# Patient Record
Sex: Female | Born: 1975 | Hispanic: Yes | Marital: Single | Smoking: Former smoker
Health system: Southern US, Community
[De-identification: ages and names within clinical notes are randomized; demographics above are authoritative.]

---

## 2016-07-13 ENCOUNTER — Other Ambulatory Visit: Payer: Self-pay | Admitting: Obstetrics & Gynecology

## 2016-07-13 DIAGNOSIS — Z1231 Encounter for screening mammogram for malignant neoplasm of breast: Secondary | ICD-10-CM

## 2016-07-26 ENCOUNTER — Ambulatory Visit
Admission: RE | Admit: 2016-07-26 | Discharge: 2016-07-26 | Disposition: A | Discharge status: Home / Self Care | Payer: No Typology Code available for payment source | Source: Ambulatory Visit | Attending: Obstetrics & Gynecology | Admitting: Obstetrics & Gynecology

## 2016-07-26 ENCOUNTER — Encounter: Payer: Self-pay | Admitting: Radiology

## 2016-07-26 DIAGNOSIS — Z1231 Encounter for screening mammogram for malignant neoplasm of breast: Secondary | ICD-10-CM

## 2017-07-29 ENCOUNTER — Other Ambulatory Visit: Payer: Self-pay | Admitting: Obstetrics and Gynecology

## 2017-07-29 DIAGNOSIS — Z1231 Encounter for screening mammogram for malignant neoplasm of breast: Secondary | ICD-10-CM

## 2017-08-25 ENCOUNTER — Ambulatory Visit
Admission: RE | Admit: 2017-08-25 | Discharge: 2017-08-25 | Disposition: A | Discharge status: Home / Self Care | Payer: No Typology Code available for payment source | Source: Ambulatory Visit | Attending: Obstetrics and Gynecology | Admitting: Obstetrics and Gynecology

## 2017-08-25 ENCOUNTER — Encounter (HOSPITAL_COMMUNITY): Payer: Self-pay

## 2017-08-25 ENCOUNTER — Ambulatory Visit (HOSPITAL_COMMUNITY)
Admission: RE | Admit: 2017-08-25 | Discharge: 2017-08-25 | Disposition: A | Discharge status: Home / Self Care | Payer: Self-pay | Source: Ambulatory Visit | Attending: Obstetrics and Gynecology | Admitting: Obstetrics and Gynecology

## 2017-08-25 VITALS — BP 120/80 | Temp 98.2°F | Ht 58.5 in | Wt 152.0 lb

## 2017-08-25 DIAGNOSIS — Z1239 Encounter for other screening for malignant neoplasm of breast: Secondary | ICD-10-CM

## 2017-08-25 DIAGNOSIS — Z1231 Encounter for screening mammogram for malignant neoplasm of breast: Secondary | ICD-10-CM

## 2017-08-25 NOTE — Patient Instructions (Signed)
Explained breast self awareness with Jerald Kiefosa Guevara. Patient did not need a Pap smear today due to last Pap smear was in September 2016 per patient. Let her know BCCCP will cover Pap smears every 3 years unless has a history of abnormal Pap smears. [facility] for [facility] mamm[facility]. Appointment scheduled for Thursday, August 25, 2017 at 1510. Let patient know the Breast Center will follow up with her within the next couple weeks with results of mammogram by letter or phone. Jerald Kiefosa Guevara verbalized understanding.  Michel Hendon, Kathaleen Maserhristine Poll, RN 2:06 PM

## 2017-08-25 NOTE — Progress Notes (Signed)
Patient complained of bilateral diffuse breast pain and some lower right breast pain around the time of her menstrual cycle every month. Patient rates the pain at a 4 out of 10.  Pap Smear: Pap smear not completed today. [facility] 2016 [facility] and normal per patient. Per patient has no history of an abnormal Pap smear. No Pap smear results are in Epic.  Physical exam: Breasts Breasts symmetrical. No skin abnormalities bilateral breasts. No nipple retraction bilateral breasts. No nipple discharge bilateral breasts. No lymphadenopathy. No lumps palpated bilateral breasts. No complaints of pain or tenderness on exam. [facility] for a screening mamm[facility]. Appointment scheduled for Thursday, August 25, 2017 at 1510.        Pelvic/Bimanual No Pap smear completed today since last Pap smear was in September 2016 per patient. Pap smear not indicated per BCCCP guidelines.   Smoking History: Patient is a current smoker. Discussed smoking cessation with patient. Referred [facility] the [facility] and gave resources to free smoking cessation classes at [facility].  Patient Navigation: Patient education provided. Access to services provided for patient through [facility]. Spanish interpreter provided.  Used Spanish interpreter [employer] McReynolds from [place].

## 2017-08-26 ENCOUNTER — Encounter (HOSPITAL_COMMUNITY): Payer: Self-pay | Admitting: *Deleted

## 2018-07-15 LAB — HM PAP SMEAR: HM PAP: NEGATIVE

## 2018-10-11 ENCOUNTER — Other Ambulatory Visit (HOSPITAL_COMMUNITY): Payer: Self-pay | Admitting: *Deleted

## 2018-10-11 DIAGNOSIS — Z1231 Encounter for screening mammogram for malignant neoplasm of breast: Secondary | ICD-10-CM

## 2018-10-30 ENCOUNTER — Encounter (HOSPITAL_COMMUNITY): Payer: Self-pay | Admitting: *Deleted

## 2018-10-30 ENCOUNTER — Other Ambulatory Visit (HOSPITAL_COMMUNITY): Payer: Self-pay | Admitting: *Deleted

## 2018-11-16 ENCOUNTER — Ambulatory Visit (HOSPITAL_COMMUNITY)
Admission: RE | Admit: 2018-11-16 | Discharge: 2018-11-16 | Disposition: A | Discharge status: Home / Self Care | Payer: No Typology Code available for payment source | Source: Ambulatory Visit | Attending: Obstetrics and Gynecology | Admitting: Obstetrics and Gynecology

## 2018-11-16 ENCOUNTER — Encounter (HOSPITAL_COMMUNITY): Payer: Self-pay

## 2018-11-16 ENCOUNTER — Ambulatory Visit: Payer: No Typology Code available for payment source

## 2018-11-16 ENCOUNTER — Other Ambulatory Visit (HOSPITAL_COMMUNITY): Payer: Self-pay | Admitting: *Deleted

## 2018-11-16 VITALS — BP 110/68 | Wt 150.0 lb

## 2018-11-16 DIAGNOSIS — N644 Mastodynia: Secondary | ICD-10-CM

## 2018-11-16 DIAGNOSIS — N6452 Nipple discharge: Secondary | ICD-10-CM

## 2018-11-16 DIAGNOSIS — Z1239 Encounter for other screening for malignant neoplasm of breast: Secondary | ICD-10-CM

## 2018-11-16 NOTE — Progress Notes (Signed)
Complaints of bilateral breast discharge when expresses and bilateral diffuse breast pain x one month. Patient stated the discharge is a greenish black color from both breast. Patient stated the pain comes and goes. Patient rates the pain at a 1-2 out of 2.  Pap Smear: Pap smear not completed today. Last Pap smear was 06/30/2018 at and normal. Per patient has no history of an abnormal Pap smear. Last Pap smear result is in Epic.  Physical exam: Breasts Breasts symmetrical. No skin abnormalities bilateral breasts. No nipple retraction bilateral breasts. Expressed a dark green colored discharge from the left breast and a greenish/yellowish colored discharge from the right breast on exam. Sample of discharge from both breasts sent to Cytology for evaluation. No lymphadenopathy. No lumps palpated bilateral breasts. Complaints of bilateral outer breast tenderness on exam. Referred patient to the Breast Center of [facility] for a diagnostic mammogram and bilateral breast ultrasound. Appointment scheduled for Tuesday, November 21, 2018 at 1350.        Pelvic/Bimanual No Pap smear completed today since last Pap smear was 06/30/2018. Pap smear not indicated per BCCCP guidelines.   Smoking History: Patient is a former smoker that quit 10/28/2017.  Patient Navigation: Patient education provided. Access to services provided for patient through [facility] program. Spanish interpreter provided.   Breast and Cervical Cancer Risk Assessment: Patient has no family history of breast cancer, known genetic mutations, or radiation treatment to the chest before age 68. Patient has no history of cervical dysplasia, immunocompromised, or DES exposure in-utero.  Risk Assessment    Risk Scores      11/16/2018   Last edited by: Lynnell Dike, LPN   5-year risk: 0.3 %   Lifetime risk: 5.1 %         Used Spanish interpreter Natale Lay from [place].

## 2018-11-16 NOTE — Patient Instructions (Signed)
Explained breast self awareness with Lisa Padilla. Patient did not need a Pap smear today due to last Pap smear was 06/30/2018. Let her know BCCCP will cover Pap smears every 3 years unless has a history of abnormal Pap smears. Referred patient to the Breast Center of [facility] for a diagnostic mammogram and bilateral breast ultrasound. Appointment scheduled for Tuesday, November 21, 2018 at 1350. Patient aware of appointment and will be there. Let patient know will follow up with her within the next couple weeks with results of breast discharge by phone. [employer] verbalized understanding.  Lisa Padilla, Lisa Maser, RN 12:34 PM

## 2018-11-17 ENCOUNTER — Encounter (HOSPITAL_COMMUNITY): Payer: Self-pay | Admitting: *Deleted

## 2018-11-21 ENCOUNTER — Ambulatory Visit: Payer: Self-pay

## 2018-11-21 ENCOUNTER — Ambulatory Visit
Admission: RE | Admit: 2018-11-21 | Discharge: 2018-11-21 | Disposition: A | Discharge status: Home / Self Care | Payer: Self-pay | Source: Ambulatory Visit | Attending: Obstetrics and Gynecology | Admitting: Obstetrics and Gynecology

## 2018-11-21 DIAGNOSIS — N6452 Nipple discharge: Secondary | ICD-10-CM

## 2018-11-27 ENCOUNTER — Telehealth (HOSPITAL_COMMUNITY): Payer: Self-pay | Admitting: *Deleted

## 2018-11-27 NOTE — Telephone Encounter (Signed)
Called patient with Spanish interpreter Nira Conn on 11/24/2018 and explained that her bilateral breast discharge was benign. Patient verbalized understanding.

## 2018-12-21 ENCOUNTER — Ambulatory Visit (HOSPITAL_COMMUNITY): Payer: No Typology Code available for payment source

## 2019-11-08 ENCOUNTER — Other Ambulatory Visit (HOSPITAL_COMMUNITY): Payer: Self-pay

## 2019-11-08 DIAGNOSIS — N6452 Nipple discharge: Secondary | ICD-10-CM

## 2019-11-09 ENCOUNTER — Other Ambulatory Visit: Payer: Self-pay | Admitting: Obstetrics and Gynecology

## 2019-11-09 DIAGNOSIS — N6452 Nipple discharge: Secondary | ICD-10-CM

## 2019-12-04 ENCOUNTER — Ambulatory Visit: Payer: PRIVATE HEALTH INSURANCE

## 2019-12-04 ENCOUNTER — Other Ambulatory Visit: Payer: Self-pay

## 2019-12-04 ENCOUNTER — Ambulatory Visit: Payer: Self-pay | Admitting: Women's Health

## 2019-12-04 ENCOUNTER — Encounter: Payer: Self-pay | Admitting: Women's Health

## 2019-12-04 ENCOUNTER — Ambulatory Visit
Admission: RE | Admit: 2019-12-04 | Discharge: 2019-12-04 | Disposition: A | Discharge status: Home / Self Care | Payer: PRIVATE HEALTH INSURANCE | Source: Ambulatory Visit | Attending: Obstetrics and Gynecology | Admitting: Obstetrics and Gynecology

## 2019-12-04 VITALS — BP 122/78 | Temp 98.5°F | Wt 149.0 lb

## 2019-12-04 DIAGNOSIS — Z1231 Encounter for screening mammogram for malignant neoplasm of breast: Secondary | ICD-10-CM

## 2019-12-04 DIAGNOSIS — Z1239 Encounter for other screening for malignant neoplasm of breast: Secondary | ICD-10-CM

## 2019-12-04 NOTE — Progress Notes (Signed)
[name] is a 44 y.o. female who presents to [facility] today with complaint of bilateral breast discharge that is green in color and only occurs on manual expression. Pt denies lumps. Pt also reports breast pain during the week before her period that is bilateral and diffuse. Pt was seen for this same concern last year, had a negative diagnostic mammogram in 10/2018 and and some of the breast discharge was sent to cytology, which was negative. Screening mammogram was recommended in one year. Patient denies any change or worsening of symptoms or any new breast complaints.   Pap Smear: Pap not smear completed today. Last Pap smear was 06/30/2018 at [facility] clinic and was normal. Per patient has no history of an abnormal Pap smear. Last Pap smear result is available in Epic.   Physical exam: Breasts Breasts symmetrical. No skin abnormalities bilateral breasts. No nipple retraction bilateral breasts. No nipple discharge bilateral breasts. No lymphadenopathy. No lumps palpated bilateral breasts.       Pelvic/Bimanual Pap is not indicated today    Smoking History: Patient has never smoked not referred to quit line.    Patient Navigation: Patient education provided. Access to services provided for patient through [facility] program. Spanish interpreter provided. No transportation provided   Colorectal Cancer Screening: Per patient has never had colonoscopy completed No complaints today.    Breast and Cervical Cancer Risk Assessment: Patient does not have family history of breast cancer, known genetic mutations, or radiation treatment to the chest before age 55. Patient does not have history of cervical dysplasia, immunocompromised, or DES exposure in-utero.  Risk Assessment    No risk assessment data for the current encounter   Risk Scores      11/16/2018   Last edited by: Lynnell Dike, LPN   5-year risk: 0.3 %   Lifetime risk: 5.1 %          A: BCCCP exam without pap  smear Complaint of bilateral breast discharge that is green in color and only occurs on manual expression. Pt denies lumps. Pt also reports breast pain during the week before her period that is bilateral and diffuse. Pt was seen for this same concern last year, had a negative diagnostic mammogram in 10/2018 and ahd some of the breast discharge sent to cytology, which was negative. Screening mammogram was recommended in one year.  P: Referred patient to the [facility] Center of [facility] for a screening mammogram per the recommendation after her diagnostic mammogram in 10/2019 with no new, worsening or changing symptoms. Appointment scheduled 12/04/2019.  Aston Lieske, Odie Sera, NP 12/04/2019 2:15 PM

## 2019-12-04 NOTE — Patient Instructions (Signed)
Autoexamen de mamas Breast Self-Awareness Autoexaminarse las mamas significa familiarizarse con el aspecto y la sensacin de las mamas al tacto. Incluye revisarse las mamas habitualmente e informarle al mdico acerca de cualquier cambio. Es importante autoexaminarse las mamas. En ocasiones, los cambios pueden no ser perjudiciales (son benignos), pero a veces un cambio en las mamas puede ser un signo de un problema mdico grave. Es importante aprender a realizar este procedimiento de modo correcto para que pueda detectar los problemas de manera temprana, cuando es ms probable que el tratamiento resulte exitoso. Todas las mujeres deben autoexaminarse las mamas, incluso aquellas que se sometieron a implantes mamarios. Lo que necesita:  Un espejo.  Una habitacin bien iluminada. Cmo realizar el autoexamen de mamas Un autoexamen de mamas es una forma de aprender qu es normal para sus mamas y si sufren modificaciones. Para hacer un autoexamen de las mamas: Busque cambios  1. Qutese toda la ropa por encima de la cintura. 2. Prese frente a un espejo en una habitacin con buena iluminacin. 3. Apoye las manos en las caderas. 4. Empuje con fuerza hacia abajo con las manos. 5. Compare las mamas en el espejo. Busque diferencias entre ellas (asimetra), por ejemplo: ? Diferencias en la forma. ? Diferencias en el tamao. ? Pliegues, depresiones y ndulos en una mama, y no en la otra. 6. Observe cada mama para buscar cambios en la piel, por ejemplo: ? Enrojecimiento. ? Zonas escamosas. 7. Observe si hay cambios en los pezones, por ejemplo: ? Secrecin. ? Sangrado. ? Hoyuelos. ? Enrojecimiento. ? Un cambio en la posicin. Palpe si hay cambios Plpese las mamas con cuidado para detectar ndulos y cambios. Lo mejor es hacerlo mientras est acostada boca arriba en el piso y nuevamente mientras est sentada o de pie en la ducha o la baera con agua jabonosa en la piel. Plpese cada mama de la  siguiente forma: 1. Coloque el brazo del lado de la mama que se examina por arriba de la cabeza. 2. Plpese la mama con la otra mano. 3. Comience en la zona del pezn y haga crculos superpuestos de de pulgada (2cm). Para hacerlo, use las yemas de los tres dedos del medio. Ejerza una presin suave, luego mediana y luego firme. La presin suave le permitir palpar el tejido ms cercano a la piel. La presin mediana le permitir palpar el tejido que est un poco ms profundo. La presin firme le permitir palpar el tejido ms cercano a las costillas. 4. Continuar superponiendo crculos y vaya hacia abajo, hasta sentir las costillas, por debajo del pecho. 5. Desplcese a una distancia del ancho de un dedo hacia el centro del cuerpo. Siga con los crculos superpuestos de de pulgada (2cm) para palpar la mama, mientras asciende lentamente hacia la clavcula. 6. Contine con el examen hacia arriba y hacia abajo con las tres presiones, hasta llegar a la axila.  Anote sus hallazgos Anotar lo que encuentra puede ayudarla a recordar qu debe consultar con el mdico. Escriba los siguientes datos:  Qu es normal para cada mama.  Cualquier cambio que encuentre en cada mama, por ejemplo: ? La clase de cambios que encuentra. ? Dolor o sensibilidad. ? Si hay bultos, su tamao y ubicacin.  En qu momento se encuentra del ciclo menstrual, si usted todava est menstruando. Recomendaciones y consejos generales  Examnese las mamas todos los meses.  Si est amamantando, el mejor momento para examinarse las mamas es despus de amamantar o de usar un sacaleches.  Si menstra,   el mejor momento para examinarse las mamas es 5 a 7das despus del perodo menstrual. Durante el perodo menstrual, las mamas en general tienen ms bultos, y tal vez sea ms difcil percibir los cambios.  Con el tiempo y la prctica, se familiarizar con las variaciones de las mamas y se sentir ms cmoda con el  examen. Comunquese con un mdico si:  Observa un cambio en la forma o el tamao de las mamas o los pezones.  Observa un cambio en la piel de las mamas o los pezones, como la piel enrojecida o escamosa.  Tiene una secrecin anormal proveniente de los pezones.  Encuentra un ndulo o una zona engrosada que no tena antes.  Tiene dolor en las mamas.  Tiene alguna inquietud relacionada con la salud de la mama. Resumen  El autoexamen de mamas incluye buscar cambios fsicos en las mamas, y tambin palpar para detectar cualquier cambio en las mamas.  El autoexamen de mamas debe hacerse frente a un espejo en una habitacin bien iluminada.  Debe examinarse las mamas todos los meses. Si menstra, el mejor momento para examinarse las mamas es de 5 a 7das despus del perodo menstrual.  Informe al mdico si nota cambios en las mamas, como cambios en el tamao, cambios en la piel, dolor o sensibilidad, o un lquido inusual que sale de los pezones. Esta informacin no tiene como fin reemplazar el consejo del mdico. Asegrese de hacerle al mdico cualquier pregunta que tenga. Document Revised: 06/13/2018 Document Reviewed: 06/13/2018 Elsevier Patient Education  2020 Elsevier Inc.  

## 2020-10-01 ENCOUNTER — Other Ambulatory Visit: Payer: Self-pay | Admitting: Obstetrics & Gynecology

## 2020-10-01 DIAGNOSIS — Z1231 Encounter for screening mammogram for malignant neoplasm of breast: Secondary | ICD-10-CM

## 2020-10-21 ENCOUNTER — Other Ambulatory Visit: Payer: Self-pay

## 2020-10-21 DIAGNOSIS — N644 Mastodynia: Secondary | ICD-10-CM

## 2020-10-27 NOTE — Addendum Note (Signed)
Addended by: Narda Rutherford on: 10/27/2020 09:47 AM   Modules accepted: Orders

## 2020-10-30 ENCOUNTER — Ambulatory Visit
Admission: RE | Admit: 2020-10-30 | Discharge: 2020-10-30 | Disposition: A | Discharge status: Home / Self Care | Payer: PRIVATE HEALTH INSURANCE | Source: Ambulatory Visit | Attending: Obstetrics and Gynecology | Admitting: Obstetrics and Gynecology

## 2020-10-30 ENCOUNTER — Other Ambulatory Visit: Payer: PRIVATE HEALTH INSURANCE

## 2020-10-30 ENCOUNTER — Ambulatory Visit: Payer: PRIVATE HEALTH INSURANCE | Admitting: *Deleted

## 2020-10-30 ENCOUNTER — Other Ambulatory Visit: Payer: Self-pay

## 2020-10-30 VITALS — BP 110/62 | Wt 144.5 lb

## 2020-10-30 DIAGNOSIS — N644 Mastodynia: Secondary | ICD-10-CM

## 2020-10-30 DIAGNOSIS — Z1239 Encounter for other screening for malignant neoplasm of breast: Secondary | ICD-10-CM

## 2020-10-30 NOTE — Patient Instructions (Signed)
Explained breast self awareness with Alfonse Alpers. Patient did not need a Pap smear today due to last Pap smear was 10/01/2020 per patient. Let her know BCCCP will cover Pap smears every 3 years unless has a history of abnormal Pap smears. Referred patient to the [facility] of [facility] for a right breast diagnostic mammogram. Appointment scheduled Thursday, October 30, 2020 at 1310. Patient aware of appointment and will be there. [employer] verbalized understanding.  Ashlee Player, Kathaleen Maser, RN 11:55 AM

## 2020-10-30 NOTE — Progress Notes (Signed)
[name] is a 45 y.o. female who presents to [facility] clinic today with complaint of right breast lump since 10/01/20 and pain x 2 months. Patient states the right breast pain comes and goes. Patient rates the pain at a 4-5 out of 10.    Pap Smear: Pap smear not completed today. Last Pap smear was 10/01/2020 at the [facility] clinic and was normal per patient. Per patient has no history of an abnormal Pap smear. Last Pap smear result is not available in Epic.   Physical exam: Breasts Breasts symmetrical. No skin abnormalities bilateral breasts. No nipple retraction bilateral breasts. No nipple discharge bilateral breasts. No lymphadenopathy. No lumps palpated bilateral breasts. Unable to palpate a lump in patients area of concern within the right breast. Complaints of bilateral outer breast tenderness on exam.      Pelvic/Bimanual Pap is not indicated today per BCCCP guidelines.   Smoking History: Patient is a former smoker that quit 10/28/2017.    Patient Navigation: Patient education provided. Access to services provided for patient through [place] program. Spanish interpreter Natale Lay from [facility] provided.   Breast and Cervical Cancer Risk Assessment: Patient does not have family history of breast cancer, known genetic mutations, or radiation treatment to the chest before age 22. Patient does not have history of cervical dysplasia, immunocompromised, or DES exposure in-utero.  Risk Assessment    Risk Scores      10/30/2020 12/04/2019   Last edited by: Narda Rutherford, LPN McGill, Sherie Demetrius Charity, LPN   5-year risk: 0.4 % 0.4 %   Lifetime risk: 5 % 5 %          A: BCCCP exam without pap smear Complaint of right breast lump and pain.  P: Referred patient to the [facility] of [facility] for a right breast diagnostic mammogram. Appointment scheduled Thursday, October 30, 2020 at 1310.  Priscille Heidelberg, RN 10/30/2020 11:55 AM

## 2020-11-17 ENCOUNTER — Other Ambulatory Visit: Payer: Self-pay | Admitting: Obstetrics and Gynecology

## 2020-11-17 DIAGNOSIS — Z1231 Encounter for screening mammogram for malignant neoplasm of breast: Secondary | ICD-10-CM

## 2020-12-11 ENCOUNTER — Ambulatory Visit: Payer: Self-pay

## 2021-01-08 ENCOUNTER — Other Ambulatory Visit: Payer: Self-pay

## 2021-01-08 ENCOUNTER — Ambulatory Visit
Admission: RE | Admit: 2021-01-08 | Discharge: 2021-01-08 | Disposition: A | Discharge status: Home / Self Care | Payer: No Typology Code available for payment source | Source: Ambulatory Visit | Attending: Obstetrics and Gynecology | Admitting: Obstetrics and Gynecology

## 2021-01-08 DIAGNOSIS — Z1231 Encounter for screening mammogram for malignant neoplasm of breast: Secondary | ICD-10-CM

## 2021-11-09 ENCOUNTER — Other Ambulatory Visit: Payer: Self-pay | Admitting: Obstetrics & Gynecology

## 2021-11-09 DIAGNOSIS — Z1231 Encounter for screening mammogram for malignant neoplasm of breast: Secondary | ICD-10-CM

## 2022-01-14 ENCOUNTER — Ambulatory Visit
Admission: RE | Admit: 2022-01-14 | Discharge: 2022-01-14 | Disposition: A | Discharge status: Home / Self Care | Payer: No Typology Code available for payment source | Source: Ambulatory Visit | Attending: Obstetrics & Gynecology | Admitting: Obstetrics & Gynecology

## 2022-01-14 DIAGNOSIS — Z1231 Encounter for screening mammogram for malignant neoplasm of breast: Secondary | ICD-10-CM

## 2022-08-16 ENCOUNTER — Emergency Department (HOSPITAL_BASED_OUTPATIENT_CLINIC_OR_DEPARTMENT_OTHER)
Admission: EM | Admit: 2022-08-16 | Discharge: 2022-08-16 | Disposition: A | Discharge status: Home / Self Care | Payer: No Typology Code available for payment source | Attending: Emergency Medicine | Admitting: Emergency Medicine

## 2022-08-16 ENCOUNTER — Emergency Department (HOSPITAL_BASED_OUTPATIENT_CLINIC_OR_DEPARTMENT_OTHER): Payer: No Typology Code available for payment source

## 2022-08-16 ENCOUNTER — Other Ambulatory Visit: Payer: Self-pay

## 2022-08-16 DIAGNOSIS — L03113 Cellulitis of right upper limb: Secondary | ICD-10-CM | POA: Insufficient documentation

## 2022-08-16 LAB — COMPREHENSIVE METABOLIC PANEL
ALT: 17 U/L (ref 0–44)
AST: 17 U/L (ref 15–41)
Albumin: 4 g/dL (ref 3.5–5.0)
Alkaline Phosphatase: 52 U/L (ref 38–126)
Anion gap: 5 (ref 5–15)
BUN: 11 mg/dL (ref 6–20)
CO2: 24 mmol/L (ref 22–32)
Calcium: 8.7 mg/dL — ABNORMAL LOW (ref 8.9–10.3)
Chloride: 110 mmol/L (ref 98–111)
Creatinine, Ser: 0.56 mg/dL (ref 0.44–1.00)
GFR, Estimated: >60 mL/min (ref >60)
Glucose, Bld: 97 mg/dL (ref 70–99)
Potassium: 4.1 mmol/L (ref 3.5–5.1)
Sodium: 139 mmol/L (ref 135–145)
Total Bilirubin: 0.3 mg/dL (ref 0.3–1.2)
Total Protein: 7.5 g/dL (ref 6.5–8.1)

## 2022-08-16 LAB — CBC WITH DIFFERENTIAL/PLATELET
Abs Immature Granulocytes: 0.02 10*3/uL (ref 0.00–0.07)
Basophils Absolute: 0 10*3/uL (ref 0.0–0.1)
Basophils Relative: 1 %
Eosinophils Absolute: 0.3 10*3/uL (ref 0.0–0.5)
Eosinophils Relative: 4 %
HCT: 41.4 % (ref 36.0–46.0)
Hemoglobin: 13.3 g/dL (ref 12.0–15.0)
Immature Granulocytes: 0 %
Lymphocytes Relative: 24 %
Lymphs Abs: 1.8 10*3/uL (ref 0.7–4.0)
MCH: 26.7 pg (ref 26.0–34.0)
MCHC: 32.1 g/dL (ref 30.0–36.0)
MCV: 83 fL (ref 80.0–100.0)
Monocytes Absolute: 0.6 10*3/uL (ref 0.1–1.0)
Monocytes Relative: 8 %
Neutro Abs: 4.8 10*3/uL (ref 1.7–7.7)
Neutrophils Relative %: 63 %
Platelets: 291 10*3/uL (ref 150–400)
RBC: 4.99 MIL/uL (ref 3.87–5.11)
RDW: 13.5 % (ref 11.5–15.5)
WBC: 7.6 10*3/uL (ref 4.0–10.5)
nRBC: 0 % (ref 0.0–0.2)

## 2022-08-16 LAB — SEDIMENTATION RATE: Sed Rate: 6 mm/hr (ref 0–22)

## 2022-08-16 MED ORDER — NAPROXEN 500 MG PO TABS: 500.0000 mg | ORAL_TABLET | Freq: Two times a day (BID) | ORAL | 0 refills | Status: AC

## 2022-08-16 MED ORDER — CEPHALEXIN 250 MG PO CAPS
500.0000 mg | ORAL_CAPSULE | Freq: Once | ORAL | Status: AC
  Administered 2022-08-16: 500 mg via ORAL
  Filled 2022-08-16: qty 2

## 2022-08-16 MED ORDER — CEPHALEXIN 500 MG PO CAPS: 500.0000 mg | ORAL_CAPSULE | Freq: Four times a day (QID) | ORAL | 0 refills | Status: AC

## 2022-08-16 NOTE — ED Triage Notes (Signed)
Patient presents to ED via POV from home. Here with right hand pain and edema. Denies any recent injury or trauma. Pain began on Wednesday.

## 2022-08-16 NOTE — ED Provider Notes (Signed)
[facility] EMERGENCY DEPARTMENT Provider Note   CSN: [id number] Arrival date & time: 08/16/22  1245     History  Chief Complaint  Patient presents with   Hand Pain    Lisa Padilla is a 46 y.o. female who presents to the emergency department with concerns for right hand pain/swelling onset 5 days.  Denies recent injury or trauma.  Denies any recent bug bites.  Has tried ibuprofen at home for her symptoms.  Also notes increased warmth noted to the area.  Denies fever.  No known drug allergies.  The history is provided by the patient. No language interpreter was used.       Home Medications Prior to Admission medications   Medication Sig Start Date End Date Taking? Authorizing Provider  cephALEXin (KEFLEX) 500 MG capsule Take 1 capsule (500 mg total) by mouth 4 (four) times daily for 7 days. 08/16/22 08/23/22 Yes Drenda Sobecki A, PA-C  naproxen (NAPROSYN) 500 MG tablet Take 1 tablet (500 mg total) by mouth 2 (two) times daily. 08/16/22  Yes Ahlivia Salahuddin A, PA-C  Multiple Vitamin (MULTIVITAMIN WITH MINERALS) TABS tablet Take 1 tablet by mouth daily.    [provider]      Allergies    Patient has no known allergies.    Review of Systems   Review of Systems  All other systems reviewed and are negative.   Physical Exam Updated Vital Signs BP 111/71   Pulse 72   Temp 98.4 F (36.9 C) (Oral)   Resp 16   SpO2 99%  Physical Exam Vitals and nursing note reviewed.  Constitutional:      General: She is not in acute distress.    Appearance: Normal appearance.  Eyes:     General: No scleral icterus.    Extraocular Movements: Extraocular movements intact.  Cardiovascular:     Rate and Rhythm: Normal rate.  Pulmonary:     Effort: Pulmonary effort is normal. No respiratory distress.  Musculoskeletal:     Cervical back: Neck supple.     Comments: Mild tenderness to palpation to fifth metacarpal at the distal aspect.  Also tenderness to palpation  noted to fifth digit.  Mild swelling noted to the fourth and fifth metacarpals/digits.  Mild increased warmth noted to the area.  No obvious deformity noted.  Patient with pain on extension of right fourth digit. Negative kanavel signs.   Skin:    General: Skin is warm and dry.     Findings: No bruising, erythema or rash.  Neurological:     Mental Status: She is alert.  Psychiatric:        Behavior: Behavior normal.        ED Results / Procedures / Treatments   Labs (all labs ordered are listed, but only abnormal results are displayed) Labs Reviewed  COMPREHENSIVE METABOLIC PANEL - Abnormal; Notable for the following components:      Result Value   Calcium 8.7 (*)    All other components within normal limits  CBC WITH DIFFERENTIAL/PLATELET  SEDIMENTATION RATE  C-REACTIVE PROTEIN    EKG None  Radiology DG Hand Complete Right  Result Date: 08/16/2022 CLINICAL DATA:  Acute right hand pain without recent injury. EXAM: RIGHT HAND - COMPLETE 3+ VIEW COMPARISON:  None Available. FINDINGS: There is no evidence of fracture or dislocation. There is no evidence of arthropathy or other focal bone abnormality. Soft tissues are unremarkable. IMPRESSION: Negative. Electronically Signed   By: Zenda Alpers.D.  On: 08/16/2022 13:15    Procedures Procedures    Medications Ordered in ED Medications  cephALEXin (KEFLEX) capsule 500 mg (500 mg Oral Given 08/16/22 1935)    ED Course/ Medical Decision Making/ A&P Clinical Course as of 08/17/22 0002  Mon Aug 16, 2022  8295 Discussed with patient lab and imaging findings and treatment plan consistent of talking with hand surgeon.  Patient agreeable at this time. [SB]  1911 Consult with hand surgeon, Dr. Frazier Butt who recommends Keflex and follow-up in the office. [SB]  1923 Discussed with patient discharge treatment plan as per hand surgeon, patient agreeable.  Patient appears safe for discharge. [SB]    Clinical Course User  Index [SB] Japleen Tornow A, PA-C                           Medical Decision Making Amount and/or Complexity of Data Reviewed Labs: ordered. Radiology: ordered.  Risk Prescription drug management.   Patient with right hand pain onset 5 days.  No recent injury or trauma.  Patient afebrile.  On exam patient with mild tenderness to palpation noted to fifth metacarpal at distal aspect.  Tenderness to palpation noted to fifth digit.  Mild swelling noted to fourth and fifth metacarpal/digits.  Mild increased warmth noted to the area.  No obvious deformity noted.  Negative Kanavel signs. Differential diagnosis includes flexor tenosynovitis, cellulitis, fracture, dislocation, sprain.   Labs:  I ordered, and personally interpreted labs.  The pertinent results include:   Sed rate unremarkable. CMP and CMP unremarkable.  CRP obtained with results pending at time of discharge  Imaging: I ordered imaging studies including right hand x-ray I independently visualized and interpreted imaging which showed: No acute findings I agree with the radiologist interpretation  Medications:  I ordered medication including Keflex for antibiotic treatment  Reevaluation of the patient after these medicines and interventions, I reevaluated the patient and found that they have improved I have reviewed the patients home medicines and have made adjustments as needed  Consultations: I requested consultation with the Hand Surgeon, Dr. Frazier Butt and discussed lab and imaging findings as well as pertinent plan - they recommend: Antibiotics and follow-up in the office   Disposition: Presentation suspicious for cellulitis of the right hand.  Doubt fracture, dislocation, sprain.  Low suspicion for flexor tenosynovitis at this time.  Case discussed with attending who evaluated patient and agrees with discharge treatment plan.  After consideration of the diagnostic results and the patients response to treatment, I feel that  the patient would benefit from Discharge home.  Patient provided with a prescription for Keflex. Supportive care measures and strict return precautions discussed with patient at bedside. Pt acknowledges and verbalizes understanding. Pt appears safe for discharge. Follow up as indicated in discharge paperwork.    This chart was dictated using voice recognition software, Dragon. Despite the best efforts of this provider to proofread and correct errors, errors may still occur which can change documentation meaning.   Final Clinical Impression(s) / ED Diagnoses Final diagnoses:  Cellulitis of right hand    Rx / DC Orders ED Discharge Orders          Ordered    cephALEXin (KEFLEX) 500 MG capsule  4 times daily        08/16/22 1913    naproxen (NAPROSYN) 500 MG tablet  2 times daily        08/16/22 1913  Jerome Viglione A, PA-C 08/17/22 2356    Glynn Octave, MD 08/18/22 [phone]

## 2022-08-16 NOTE — Discharge Instructions (Addendum)
It was a pleasure taking care of you today!   Your labs didn't show any concerning findings. Your chest xray was negative today.  You will be sent a prescription for Keflex (antibiotic), ensure to complete the entire course of the antibiotic.  You will be sent a prescription for Naprosyn, take as directed.  Attached is information for the on-call hand specialist, call and set up a follow-up appointment regarding today's ED visit.  Return to the emergency department for experience increasing/worsening symptoms.  Fue un placer cuidarte hoy!  Sus anlisis no Counselling psychologist. Tu radiografa de trax fue negativa hoy. Se le enviar una receta para Keflex (antibitico), asegrese de completar todo el tratamiento con antibiticos. Se le enviar una receta para Naprosyn, tmela segn las indicaciones. Se adjunta informacin para el especialista de guardia, llame y programe una cita de seguimiento con respecto a la visita al [employer] [facility] [place]. Regrese al departamento de emergencias si experimenta sntomas que aumentan o empeoran.

## 2022-08-16 NOTE — ED Notes (Signed)
ED Provider at bedside. 

## 2022-08-17 LAB — C-REACTIVE PROTEIN: CRP: 0.9 mg/dL (ref <1.0)

## 2022-11-02 ENCOUNTER — Other Ambulatory Visit: Payer: Self-pay | Admitting: Obstetrics & Gynecology

## 2022-11-02 DIAGNOSIS — Z1231 Encounter for screening mammogram for malignant neoplasm of breast: Secondary | ICD-10-CM

## 2022-11-04 IMAGING — MG MM DIGITAL SCREENING BILAT W/ TOMO AND CAD
8 series · 9 of 24 positions shown · non-contrast
Comparison: Previous exam(s).

CLINICAL DATA: Screening.

EXAM:
DIGITAL SCREENING BILATERAL MAMMOGRAM WITH TOMOSYNTHESIS AND CAD
TECHNIQUE: Bilateral screening digital craniocaudal and mediolateral oblique
mammograms were obtained. Bilateral screening digital breast
tomosynthesis was performed. The images were evaluated with
computer-aided detection.

[L MLO synth-2D]
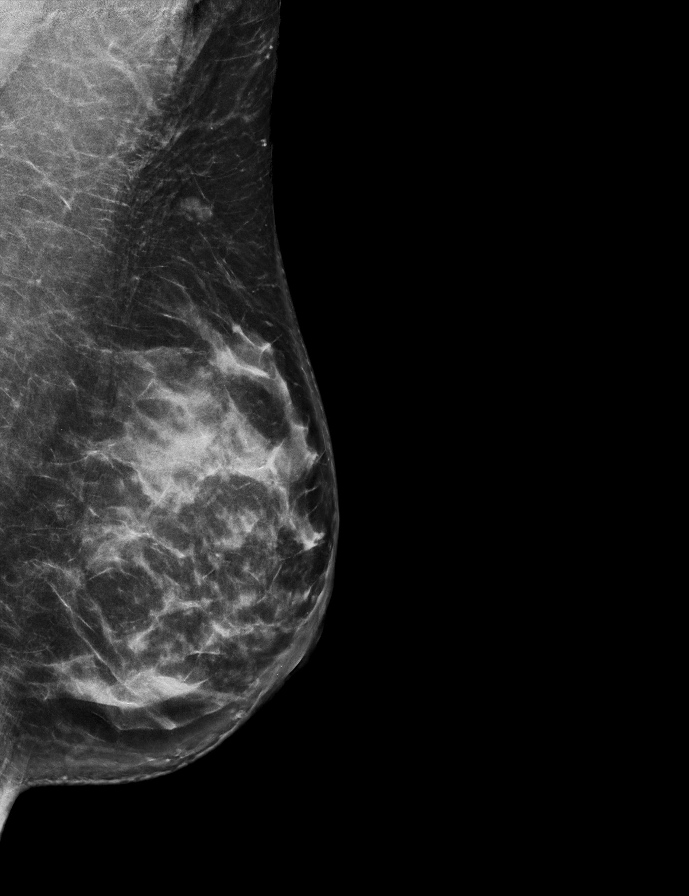

[R MLO synth-2D]
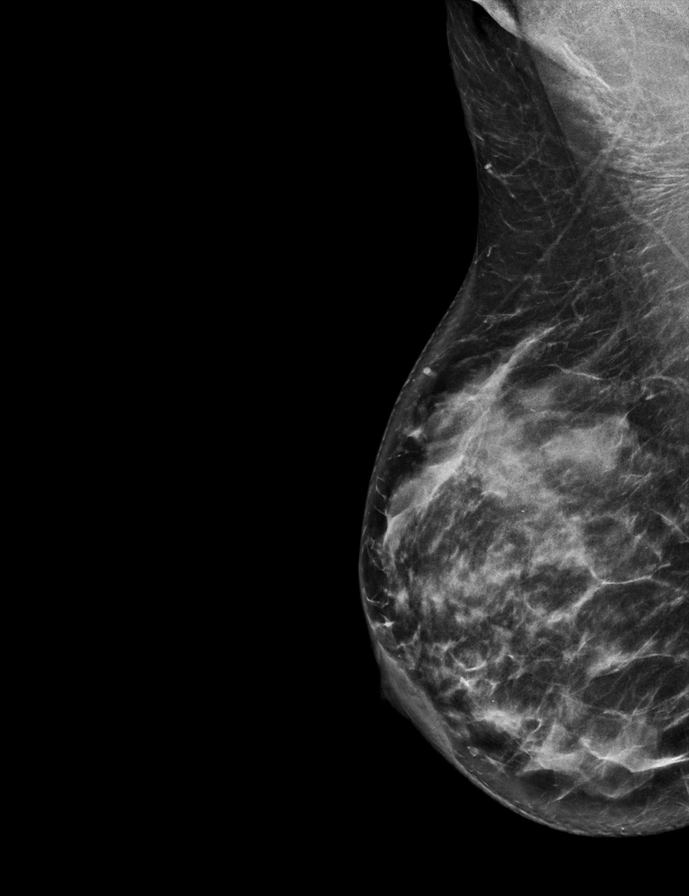

[L CC synth-2D]
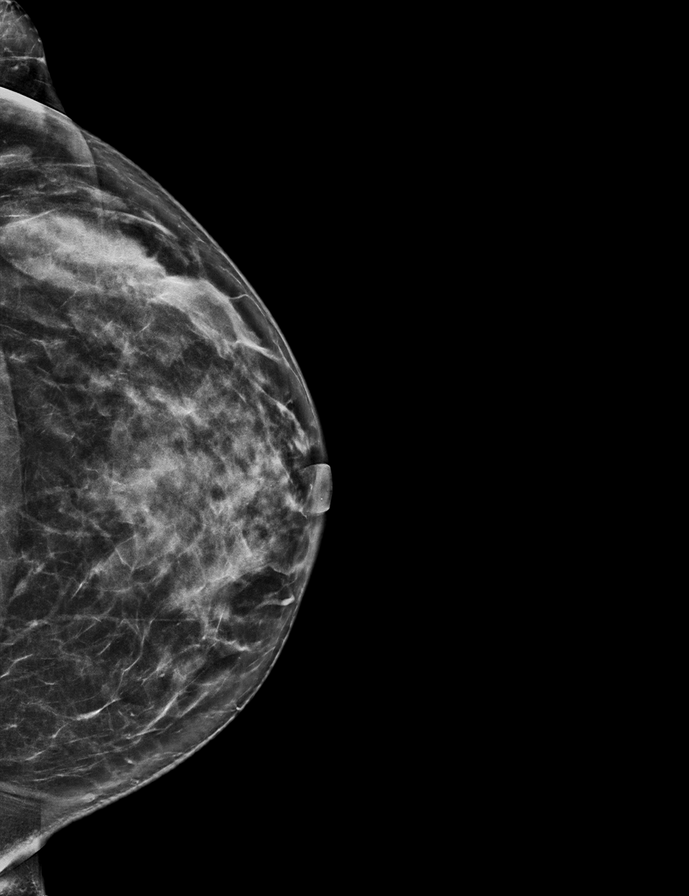

[R CC synth-2D]
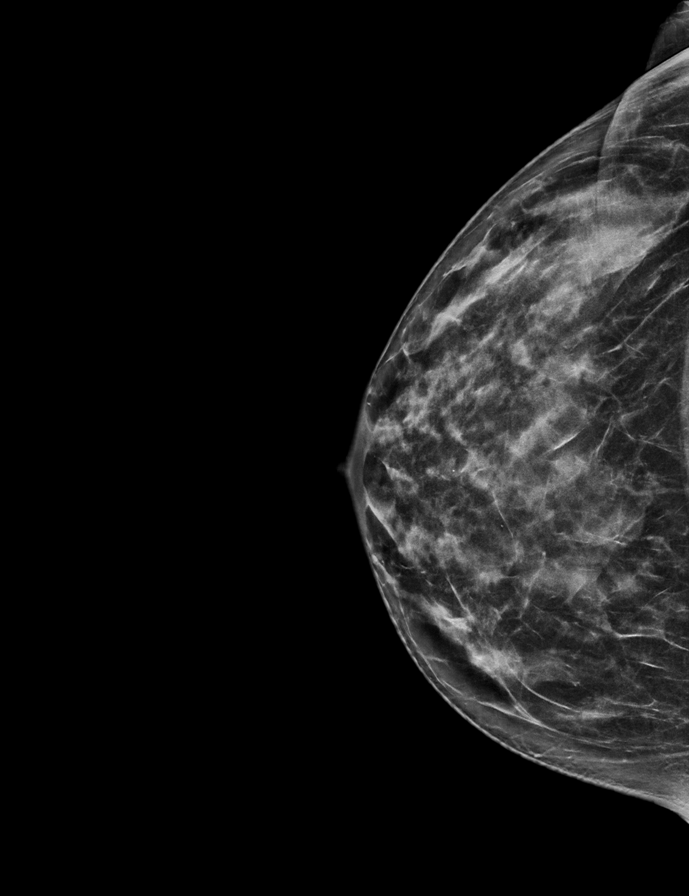

[L MLO tomo · 2 of 72 frames shown]
[frame 24/72]
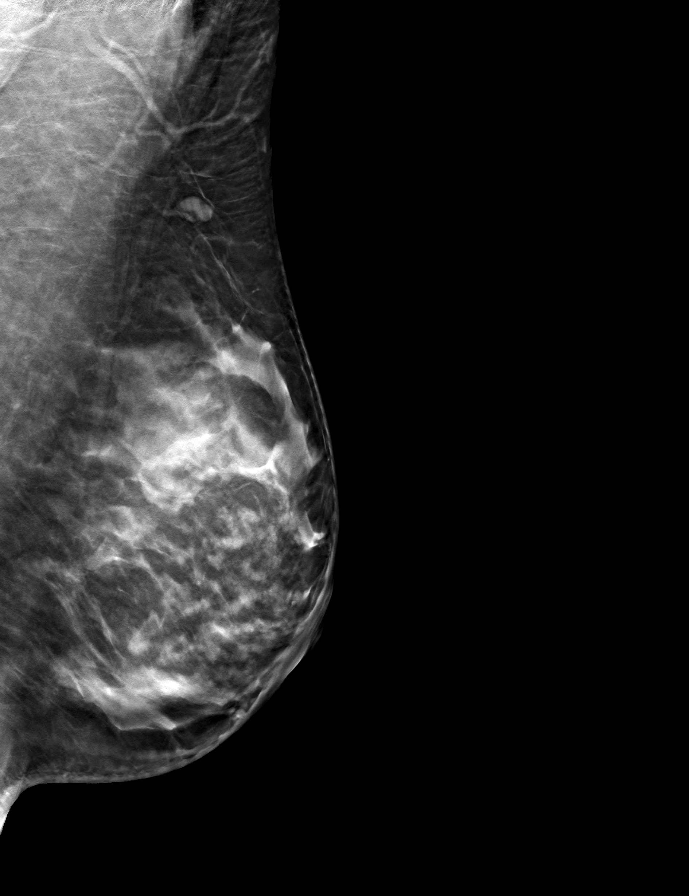
[frame 37/72]
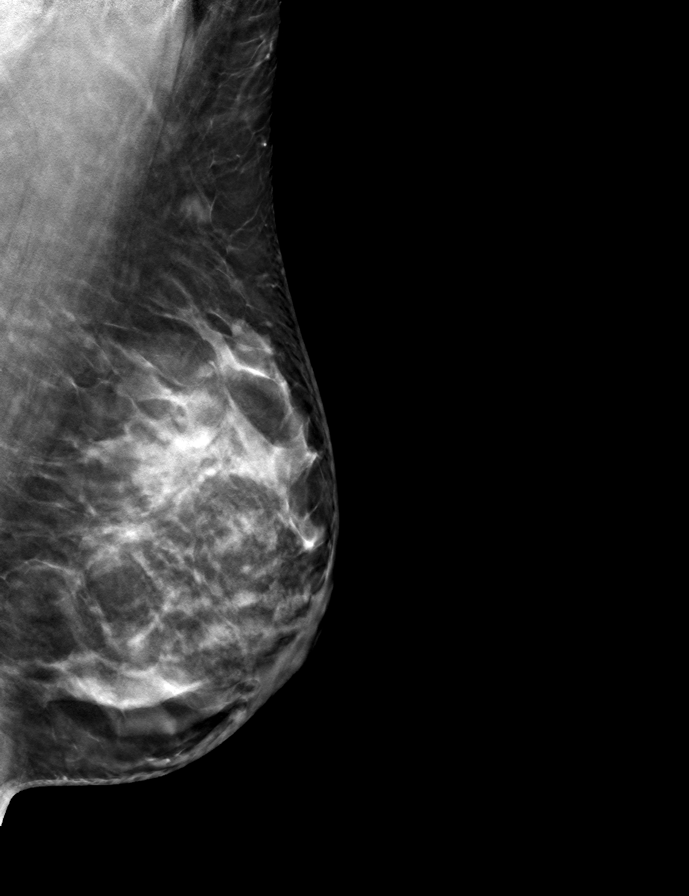

[R CC tomo · tomo slice 34/67.0]
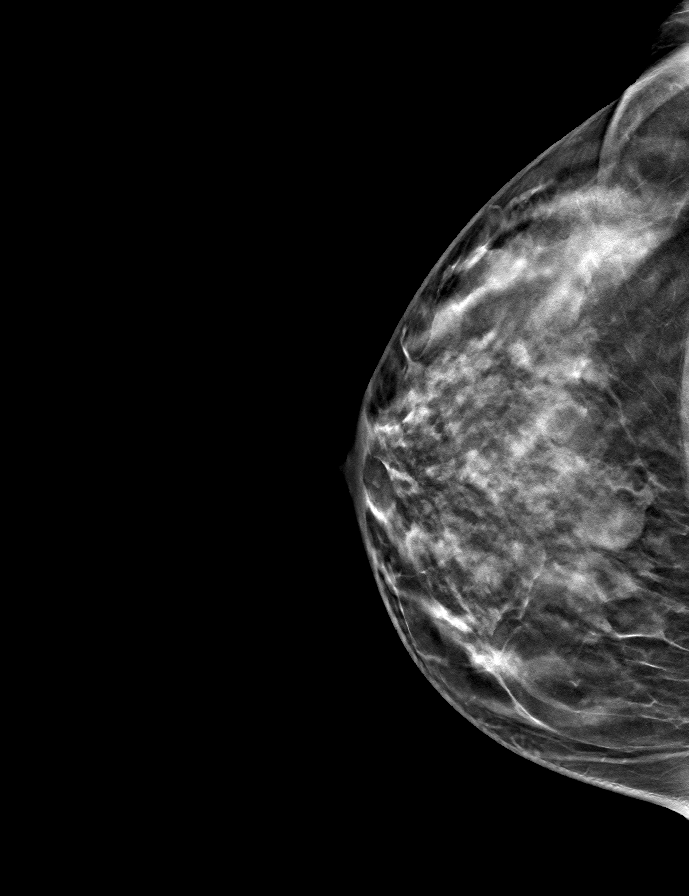

[R MLO tomo · tomo slice 35/69.0]
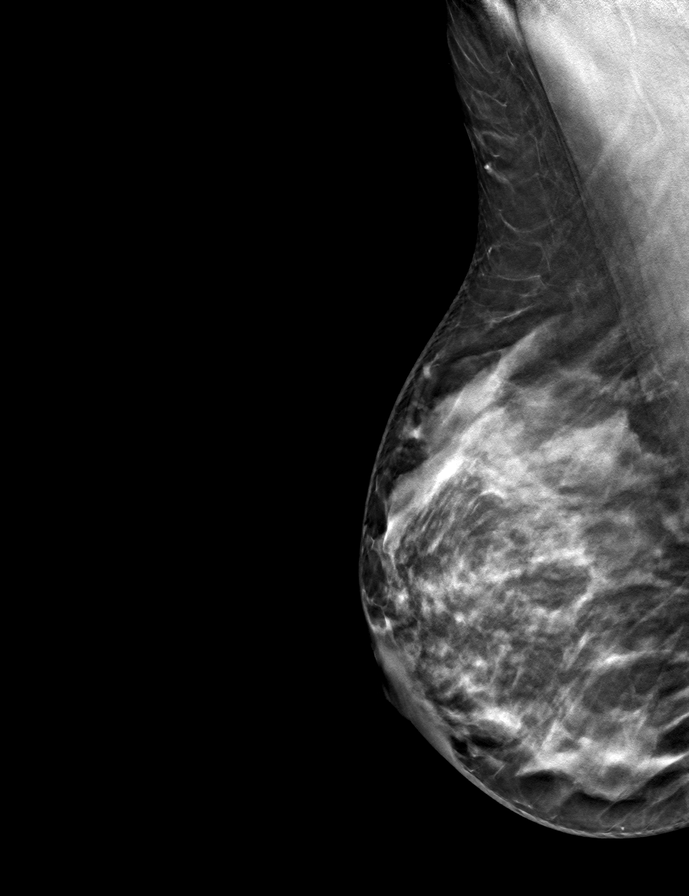

[L CC tomo · tomo slice 33/64.0]
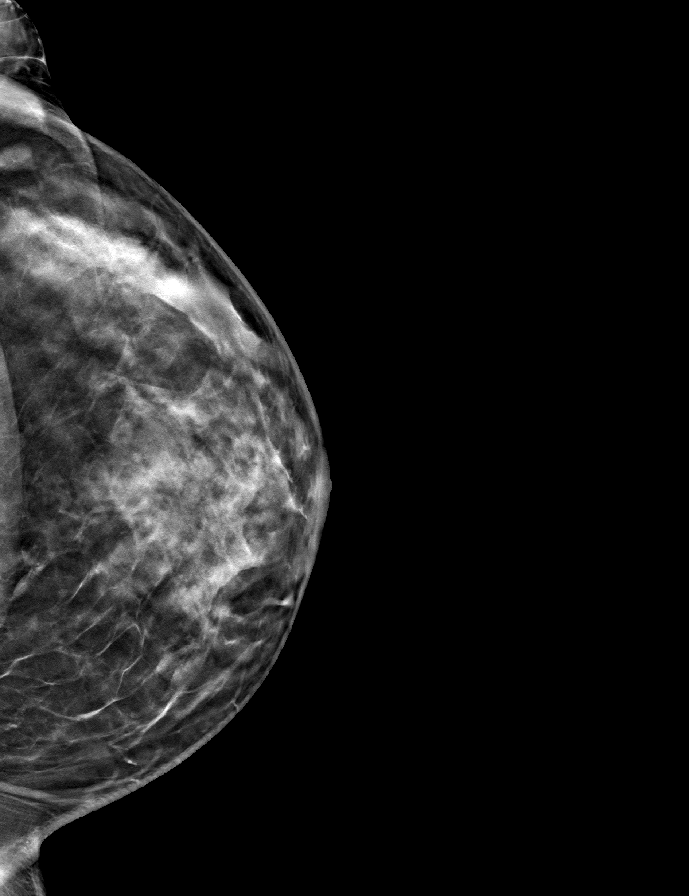

[9 of 24 positions shown; findings below may reference images not displayed]

ACR Breast Density Category c: The breast tissue is heterogeneously
dense, which may obscure small masses.
FINDINGS: There are no findings suspicious for malignancy.
IMPRESSION: No mammographic evidence of malignancy. A result letter of this
screening mammogram will be mailed directly to the patient.

RECOMMENDATION:
Screening mammogram in one year. (Code:Q3-W-BC3)

BI-RADS CATEGORY  1: Negative.

## 2023-01-20 ENCOUNTER — Ambulatory Visit
Admission: RE | Admit: 2023-01-20 | Discharge: 2023-01-20 | Disposition: A | Discharge status: Home / Self Care | Payer: No Typology Code available for payment source | Source: Ambulatory Visit | Attending: Obstetrics & Gynecology | Admitting: Obstetrics & Gynecology

## 2023-01-20 DIAGNOSIS — Z1231 Encounter for screening mammogram for malignant neoplasm of breast: Secondary | ICD-10-CM

## 2023-12-15 ENCOUNTER — Telehealth: Payer: Self-pay

## 2023-12-15 NOTE — Telephone Encounter (Signed)
 Telephoned patient at mobile number using interpreter, Delorise Royals. Left a voice message with BCCCP contact information.

## 2023-12-22 ENCOUNTER — Other Ambulatory Visit: Payer: Self-pay | Admitting: Obstetrics and Gynecology

## 2023-12-22 DIAGNOSIS — Z1231 Encounter for screening mammogram for malignant neoplasm of breast: Secondary | ICD-10-CM

## 2024-02-23 ENCOUNTER — Ambulatory Visit
Admission: RE | Admit: 2024-02-23 | Discharge: 2024-02-23 | Disposition: A | Discharge status: Home / Self Care | Source: Ambulatory Visit | Attending: Obstetrics and Gynecology | Admitting: Obstetrics and Gynecology

## 2024-02-23 ENCOUNTER — Ambulatory Visit: Payer: Self-pay | Admitting: *Deleted

## 2024-02-23 VITALS — BP 111/76 | Wt 160.0 lb

## 2024-02-23 DIAGNOSIS — Z1231 Encounter for screening mammogram for malignant neoplasm of breast: Secondary | ICD-10-CM

## 2024-02-23 DIAGNOSIS — Z1239 Encounter for other screening for malignant neoplasm of breast: Secondary | ICD-10-CM

## 2024-02-23 NOTE — Progress Notes (Signed)
 [name] is a 48 y.o. female who presents to [facility] clinic today with no complaints.    Pap Smear: Pap smear completed today. Last Pap smear was in February 2025  at [facility] clinic and was normal per patient. Per patient has no history of an abnormal Pap smear. Last Pap smear result is not available in Epic.   Physical exam: Breasts Breasts symmetrical. No skin abnormalities bilateral breasts. No nipple retraction bilateral breasts. No nipple discharge bilateral breasts. No lymphadenopathy. No lumps palpated bilateral breasts. No complaints of pain or tenderness on exam.      MS DIGITAL SCREENING TOMO BILATERAL Result Date: 01/24/2023 CLINICAL DATA:  Screening. EXAM: DIGITAL SCREENING BILATERAL MAMMOGRAM WITH TOMOSYNTHESIS AND CAD TECHNIQUE: Bilateral screening digital craniocaudal and mediolateral oblique mammograms were obtained. Bilateral screening digital breast tomosynthesis was performed. The images were evaluated with computer-aided detection. COMPARISON:  Previous exam(s). ACR Breast Density Category c: The breasts are heterogeneously dense, which may obscure small masses. FINDINGS: There are no findings suspicious for malignancy. IMPRESSION: No mammographic evidence of malignancy. A result letter of this screening mammogram will be mailed directly to the patient. RECOMMENDATION: Screening mammogram in one year. (Code:[id number]) BI-RADS CATEGORY  1: Negative. Electronically Signed   By: Roda Cirri M.D.   On: 01/24/2023 13:29   MS DIGITAL SCREENING TOMO BILATERAL Result Date: 01/15/2022 CLINICAL DATA:  Screening. EXAM: DIGITAL SCREENING BILATERAL MAMMOGRAM WITH TOMOSYNTHESIS AND CAD TECHNIQUE: Bilateral screening digital craniocaudal and mediolateral oblique mammograms were obtained. Bilateral screening digital breast tomosynthesis was performed. The images were evaluated with computer-aided detection. COMPARISON:  Previous exam(s). ACR Breast Density Category c: The breast  tissue is heterogeneously dense, which may obscure small masses. FINDINGS: There are no findings suspicious for malignancy. IMPRESSION: No mammographic evidence of malignancy. A result letter of this screening mammogram will be mailed directly to the patient. RECOMMENDATION: Screening mammogram in one year. (Code:[id number]) BI-RADS CATEGORY  1: Negative. Electronically Signed   By: Dobrinka  Dimitrova M.D.   On: 01/15/2022 10:00   MS DIGITAL SCREENING TOMO BILATERAL Result Date: 01/09/2021 CLINICAL DATA:  Screening. EXAM: DIGITAL SCREENING BILATERAL MAMMOGRAM WITH TOMOSYNTHESIS AND CAD TECHNIQUE: Bilateral screening digital craniocaudal and mediolateral oblique mammograms were obtained. Bilateral screening digital breast tomosynthesis was performed. The images were evaluated with computer-aided detection. COMPARISON:  Previous exam(s). ACR Breast Density Category c: The breast tissue is heterogeneously dense, which may obscure small masses. FINDINGS: There are no findings suspicious for malignancy. The images were evaluated with computer-aided detection. IMPRESSION: No mammographic evidence of malignancy. A result letter of this screening mammogram will be mailed directly to the patient. RECOMMENDATION: Screening mammogram in one year. (Code:[id number]-01Y) BI-RADS CATEGORY  1: Negative. Electronically Signed   By: Peggie Bowen M.D.   On: 01/09/2021 08:34   MS DIGITAL DIAG TOMO UNI RIGHT Result Date: 10/30/2020 CLINICAL DATA:  48 year old female with palpable thickening and pain in the LOWER RIGHT breast identified on self-examination. EXAM: DIGITAL DIAGNOSTIC RIGHT MAMMOGRAM WITH CAD AND TOMOSYNTHESIS ULTRASOUND RIGHT BREAST TECHNIQUE: Right digital diagnostic mammography and breast tomosynthesis was performed. Digital images of the breasts were evaluated with computer-aided detection. Targeted ultrasound examination of the Right breast was performed. COMPARISON:  Previous exam(s). ACR Breast Density Category c: The  breast tissue is heterogeneously dense, which may obscure small masses. FINDINGS: 2D/3D full field views of the RIGHT breast demonstrate no suspicious mass, distortion or worrisome calcifications. Targeted ultrasound is performed, showing no sonographic abnormality within the LOWER RIGHT breast. IMPRESSION: 1. No mammographic  or sonographic abnormality within the LOWER RIGHT breast, in the area of patient concern. 2. No mammographic evidence of RIGHT breast malignancy. RECOMMENDATION: Bilateral screening mammogram in 2 months to resume annual mammogram schedule. I have discussed the findings and recommendations with the patient. If applicable, a reminder letter will be sent to the patient regarding the next appointment. BI-RADS CATEGORY  1: Negative. Electronically Signed   By: Sundra Engel M.D.   On: 10/30/2020 13:42   MS DIGITAL SCREENING TOMO BILATERAL Result Date: 12/05/2019 CLINICAL DATA:  Screening. EXAM: DIGITAL SCREENING BILATERAL MAMMOGRAM WITH TOMO AND CAD COMPARISON:  Previous exam(s). ACR Breast Density Category c: The breast tissue is heterogeneously dense, which may obscure small masses. FINDINGS: There are no findings suspicious for malignancy. Images were processed with CAD. IMPRESSION: No mammographic evidence of malignancy. A result letter of this screening mammogram will be mailed directly to the patient. RECOMMENDATION: Screening mammogram in one year. (Code:[id number]-01Y) BI-RADS CATEGORY  1: Negative. Electronically Signed   By: Dina  Arceo M.D.   On: 12/05/2019 09:18   Pelvic/Bimanual Pap is not indicated today per BCCCP guidelines.   Smoking History: Patient has never smoked.   Patient Navigation: Patient education provided. Access to services provided for patient through [facility] program. Spanish interpreter Barbette Boom from [facility] provided.  Colorectal Cancer Screening: Per patient has never had colonoscopy completed. Patient stated completed a FIT test given by her PCP in February  2025 and was negative. No complaints today.    Breast and Cervical Cancer Risk Assessment: Patient does not have family history of breast cancer, known genetic mutations, or radiation treatment to the chest before age 90. Patient does not have history of cervical dysplasia, immunocompromised, or DES exposure in-utero.  Risk Scores as of Encounter on 02/23/2024     Gregary Lean           5-year 0.53%   Lifetime 6.47%            Last calculated by Silas, Ansyi K, CMA on 02/23/2024 at  2:28 PM        A: BCCCP exam without pap smear No complaints.  P: Referred patient to the [facility] for a screening mammogram on mobile unit. Appointment scheduled Thursday, Feb 23, 2024 at 1500.  Stefan Edge, RN 02/23/2024 2:21 PM

## 2024-02-23 NOTE — Patient Instructions (Signed)
 Explained breast self awareness with Rochele Christmas. Patient did not need a Pap smear today due to last Pap smear was in February 2025 per patient. Let her know BCCCP will cover Pap smears every 3 years unless has a history of abnormal Pap smears. Referred patient to the [facility] for a screening mammogram on mobile unit. Appointment scheduled Thursday, Feb 23, 2024 at 1500. Patient aware of appointment and will be there. Let patient know the Breast Center will follow up with her within the next couple weeks with results of mammogram by letter or phone. [employer] verbalized understanding.  Kelwin Gibler, Dela Favor, RN 2:21 PM

## 2024-03-01 ENCOUNTER — Other Ambulatory Visit: Payer: Self-pay | Admitting: Obstetrics and Gynecology

## 2024-03-01 DIAGNOSIS — R928 Other abnormal and inconclusive findings on diagnostic imaging of breast: Secondary | ICD-10-CM

## 2024-03-15 ENCOUNTER — Ambulatory Visit
Admission: RE | Admit: 2024-03-15 | Discharge: 2024-03-15 | Disposition: A | Discharge status: Home / Self Care | Source: Ambulatory Visit | Attending: Obstetrics and Gynecology | Admitting: Obstetrics and Gynecology

## 2024-03-15 DIAGNOSIS — R928 Other abnormal and inconclusive findings on diagnostic imaging of breast: Secondary | ICD-10-CM

## 2024-08-15 ENCOUNTER — Encounter: Payer: Self-pay | Admitting: *Deleted

## 2024-08-30 ENCOUNTER — Ambulatory Visit

## 2024-10-25 ENCOUNTER — Ambulatory Visit: Admitting: *Deleted

## 2024-10-25 ENCOUNTER — Telehealth: Payer: Self-pay

## 2024-10-25 VITALS — Wt 175.0 lb

## 2024-10-25 DIAGNOSIS — Z01419 Encounter for gynecological examination (general) (routine) without abnormal findings: Secondary | ICD-10-CM

## 2024-10-25 NOTE — Patient Instructions (Signed)
 Pap smear completed today. Informed Raoul Adolfo Orchard that BCCCP will cover Pap smears and HPV typing every 5 years unless has a history of abnormal Pap smears. Let patient know will follow up with her within the next couple weeks with results of her Pap smear by letter or phone. [employer] verbalized understanding.  Kennedey Digilio, Wanda Ship, RN 3:42 PM

## 2024-10-25 NOTE — Progress Notes (Signed)
 [name] is a 49 y.o. [id number] female who presents to [facility] clinic today with no complaints.    Pap Smear: Pap smear completed today. Last Pap smear was 11/25/2023 at [facility] clinic and was unsatisfactory for evaluation. Per patient has no history of an abnormal Pap smear. Last Pap smear result is available in Epic.   Pelvic/Bimanual Ext Genitalia No lesions, no swelling and no discharge observed on external genitalia.        Vagina Vagina pink and normal texture. No lesions or discharge observed in vagina.        Cervix Cervix is present. Cervix pink and reddened at os. IUD strings visualized. Blood at cervical os. Patient states she has AUB.   Uterus Uterus is present and palpable. Uterus in normal position and normal size.        Adnexae Bilateral ovaries present and palpable. No tenderness on palpation.         Rectovaginal No rectal exam completed today since patient had no rectal complaints. No skin abnormalities observed on exam.     Smoking History: Patient has never smoked.   Patient Navigation: Patient education provided. Access to services provided for patient through [facility] program. Spanish interpreter Bernice Angry from [facility] provided.   Colorectal Cancer Screening: Per patient has never had colonoscopy completed. Patient stated completed a FIT test given by her PCP in February 2025 and was negative. No complaints today.    Breast and Cervical Cancer Risk Assessment: Patient does not have family history of breast cancer, known genetic mutations, or radiation treatment to the chest before age 21. Patient does not have history of cervical dysplasia, immunocompromised, or DES exposure in-utero.  Risk Scores as of Encounter on 10/25/2024     [name] as of 02/23/2024           5-year 0.53%   Lifetime 6.47%            Last calculated by Silas, Ansyi K, CMA on 02/23/2024 at  2:28 PM        A: BCCCP exam with pap smear No complaints.  Driscilla Wanda SQUIBB, RN 10/25/2024 3:42 PM

## 2024-10-25 NOTE — Telephone Encounter (Signed)
 Via Maritza Afanador, [facility] Spanish Interpeter, Patient contacted to verify she was aware of appointment for pap smear today at 3 pm, and was not having any vaginal bleeding as she did with previous pap smears. Patient confirmed she was coming to appointment to appointment at 3 pm, and was not bleeding.

## 2024-10-26 LAB — CYTOLOGY - PAP
Comment: NEGATIVE
Diagnosis: NEGATIVE
High risk HPV: NEGATIVE
